# Patient Record
Sex: Female | Born: 1980 | Race: Black or African American | Hispanic: No | Marital: Single | Smoking: Never smoker
Health system: Southern US, Community
[De-identification: ages and names within clinical notes are randomized; demographics above are authoritative.]

## PROBLEM LIST (undated history)

## (undated) DIAGNOSIS — J45909 Unspecified asthma, uncomplicated: Secondary | ICD-10-CM

---

## 2017-08-21 ENCOUNTER — Emergency Department (HOSPITAL_BASED_OUTPATIENT_CLINIC_OR_DEPARTMENT_OTHER)
Admission: EM | Admit: 2017-08-21 | Discharge: 2017-08-21 | Disposition: A | Discharge status: Home / Self Care | Payer: Self-pay | Attending: Emergency Medicine | Admitting: Emergency Medicine

## 2017-08-21 ENCOUNTER — Other Ambulatory Visit: Payer: Self-pay

## 2017-08-21 ENCOUNTER — Encounter (HOSPITAL_BASED_OUTPATIENT_CLINIC_OR_DEPARTMENT_OTHER): Payer: Self-pay | Admitting: *Deleted

## 2017-08-21 DIAGNOSIS — J45909 Unspecified asthma, uncomplicated: Secondary | ICD-10-CM | POA: Insufficient documentation

## 2017-08-21 DIAGNOSIS — T7840XA Allergy, unspecified, initial encounter: Secondary | ICD-10-CM | POA: Insufficient documentation

## 2017-08-21 HISTORY — DX: Unspecified asthma, uncomplicated: J45.909

## 2017-08-21 MED ORDER — ALBUTEROL SULFATE HFA 108 (90 BASE) MCG/ACT IN AERS: 2.0000 | INHALATION_SPRAY | RESPIRATORY_TRACT | 3 refills | Status: AC | PRN

## 2017-08-21 NOTE — ED Notes (Signed)
RT assessed patient upon arrival. Stated she was at school and had an allergic reaction to grass. Given Albuterol MDI 3 puffs at school and took 2 benedryl. No distress noted at this time.

## 2017-08-21 NOTE — ED Notes (Signed)
ED Provider at bedside. 

## 2017-08-21 NOTE — ED Triage Notes (Signed)
Shortness of breathe started from grass.  They were cutting grass in school today.  Albuterol inhaler and 2 Benadryl  Were given at school PTA.

## 2017-08-21 NOTE — ED Provider Notes (Signed)
[facility] EMERGENCY DEPARTMENT Provider Note   CSN: [id number] Arrival date & time: 08/21/17  0865     History   Chief Complaint Chief Complaint  Patient presents with  . Shortness of Breath    HPI Dominique Franklin is a 37 y.o. female.  Patient c/o allergic reaction to grass. States hx same. Was at school and grass was being cut, states started to feel throat itching and swelling, and sob. Hx asthma. Was given 2 benadryl tablets and albuterol treatment and states feels improved. No preceding sore throat. No cough or uri symptoms. Recently asthma at baseline, uses Symbicort but no recent increased use of albuterol. Denies fever or chills. No chest pain or discomfort. No faintness or dizziness. No rash.   The history is provided by the patient.  Shortness of Breath  Pertinent negatives include no fever, no sore throat, no neck pain, no cough, no chest pain, no abdominal pain and no rash.    Past Medical History:  Diagnosis Date  . Asthma       OB History   None      Home Medications    Prior to Admission medications   Not on File    Family History No family history on file.  Social History Social History   Tobacco Use  . Smoking status: Not on file  Substance Use Topics  . Alcohol use: Not on file  . Drug use: Not on file     Allergies   Patient has no allergy information on record.   Review of Systems Review of Systems  Constitutional: Negative for fever.  HENT: Negative for sore throat.   Eyes: Negative for redness.  Respiratory: Positive for shortness of breath. Negative for cough.   Cardiovascular: Negative for chest pain.  Gastrointestinal: Negative for abdominal pain.  Genitourinary: Negative for flank pain.  Musculoskeletal: Negative for neck pain.  Skin: Negative for rash.  Neurological: Negative for syncope.  Hematological: Does not bruise/bleed easily.  Psychiatric/Behavioral: Negative for confusion.     Physical  Exam Updated Vital Signs BP (!) 141/83 (BP Location: Right Arm)   Pulse 73   Temp 97.8 F (36.6 C) (Oral)   Resp 18   Ht 1.702 m (5\' 7" )   Wt 101.6 kg (224 lb)   SpO2 100%   BMI 35.08 kg/m   Physical Exam  Constitutional: She appears well-developed and well-nourished.  HENT:  Head: Atraumatic.  Nose: Nose normal.  Mouth/Throat: Oropharynx is clear and moist.  Eyes: Conjunctivae are normal. No scleral icterus.  Neck: Neck supple. No tracheal deviation present.  Cardiovascular: Normal rate, regular rhythm, normal heart sounds and intact distal pulses.  Pulmonary/Chest: Effort normal and breath sounds normal. No stridor. No respiratory distress. She has no wheezes.  Abdominal: Normal appearance. She exhibits no distension. There is no tenderness.  Musculoskeletal: She exhibits no edema.  Neurological: She is alert.  Skin: Skin is warm and dry. No rash noted.  Psychiatric:  Mildly anxious appearing.   Nursing note and vitals reviewed.    ED Treatments / Results  Labs (all labs ordered are listed, but only abnormal results are displayed) Labs Reviewed - No data to display  EKG None  Radiology No results found.  Procedures Procedures (including critical care time)  Medications Ordered in ED Medications - No data to display   Initial Impression / Assessment and Plan / ED Course  I have reviewed the triage vital signs and the nursing notes.  Pertinent labs &  imaging results that were available during my care of the patient were reviewed by me and considered in my medical decision making (see chart for details).  Monitor, pulse ox.   Pulse ox 100%. No edema on exam. No wheezing.  Reviewed nursing notes and prior charts for additional history.   Recheck, no swelling, no sob, no itching, no wheezing.  Pt currently appears stable for d/c.      Final Clinical Impressions(s) / ED Diagnoses   Final diagnoses:  None    ED Discharge Orders    None        Cathren LaineSteinl, Danen Lapaglia, MD 08/21/17 1028

## 2017-08-21 NOTE — Discharge Instructions (Signed)
It was our pleasure to provide your ER care today - we hope that you feel better.  Take benadryl 25 mg every 6 hours for the next day, then as need.   Use albuterol inhaler as need if wheezing.  Follow up with primary care doctor in the coming week - discuss possible referral to allergist.   Return to ER if worse, new symptoms, difficulty breathing, throat swelling, other concern.

## 2018-06-25 ENCOUNTER — Other Ambulatory Visit: Payer: Self-pay | Admitting: Physician Assistant

## 2018-06-25 ENCOUNTER — Other Ambulatory Visit: Payer: Self-pay | Admitting: *Deleted

## 2018-06-25 DIAGNOSIS — M7989 Other specified soft tissue disorders: Secondary | ICD-10-CM

## 2018-07-19 ENCOUNTER — Ambulatory Visit
Admission: RE | Admit: 2018-07-19 | Discharge: 2018-07-19 | Disposition: A | Discharge status: Home / Self Care | Payer: No Typology Code available for payment source | Source: Ambulatory Visit | Attending: Physician Assistant | Admitting: Physician Assistant

## 2018-07-19 ENCOUNTER — Other Ambulatory Visit: Payer: Self-pay

## 2018-07-19 DIAGNOSIS — M7989 Other specified soft tissue disorders: Secondary | ICD-10-CM

## 2020-07-15 IMAGING — MG DIGITAL DIAGNOSTIC BILATERAL MAMMOGRAM WITH TOMO AND CAD
5 of 10 series · 5 of 30 positions shown · non-contrast
Comparison: None
COMPARISON: None

Addendum:
CLINICAL DATA: Patient presents complaining of left axillary
swelling some tenderness. Patient's referring clinician also reports
a small nodule along the inferior aspect the left axilla. Patient's
sister was diagnosed with breast carcinoma at age 41.

EXAM:
DIGITAL DIAGNOSTIC BILATERAL MAMMOGRAM WITH CAD AND TOMO
ULTRASOUND LEFT AXILLA

[L CC synth-2D]
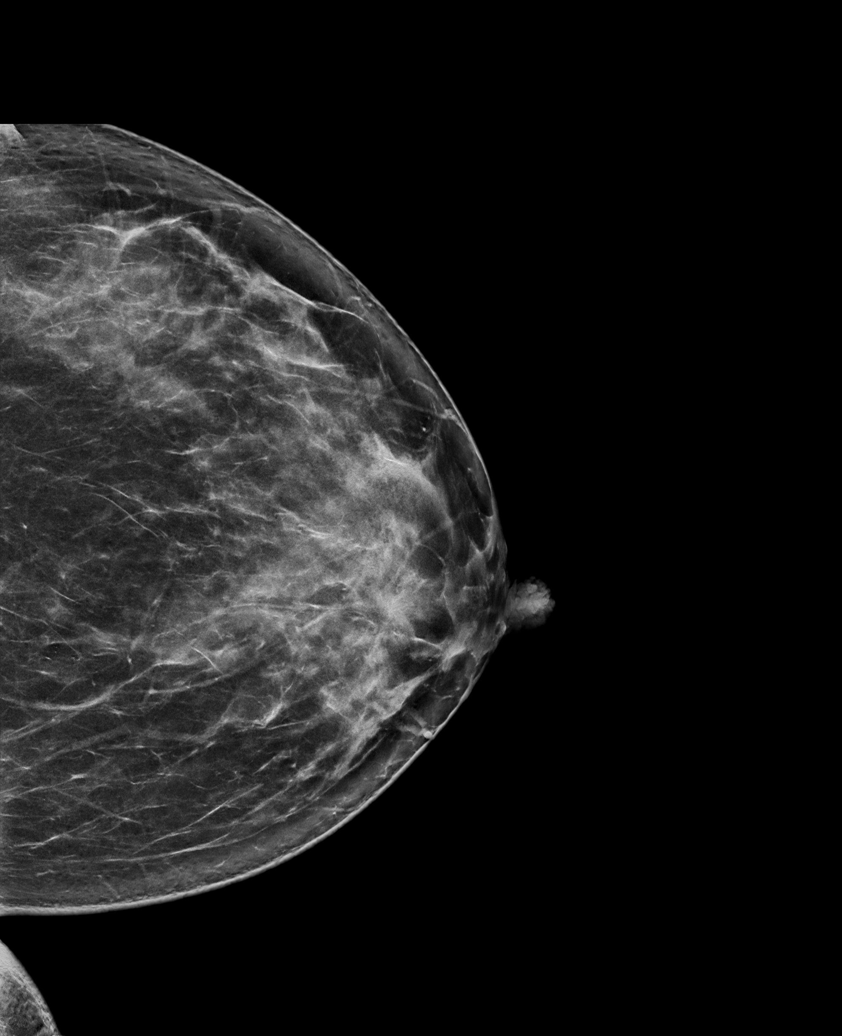

[L MLO synth-2D]
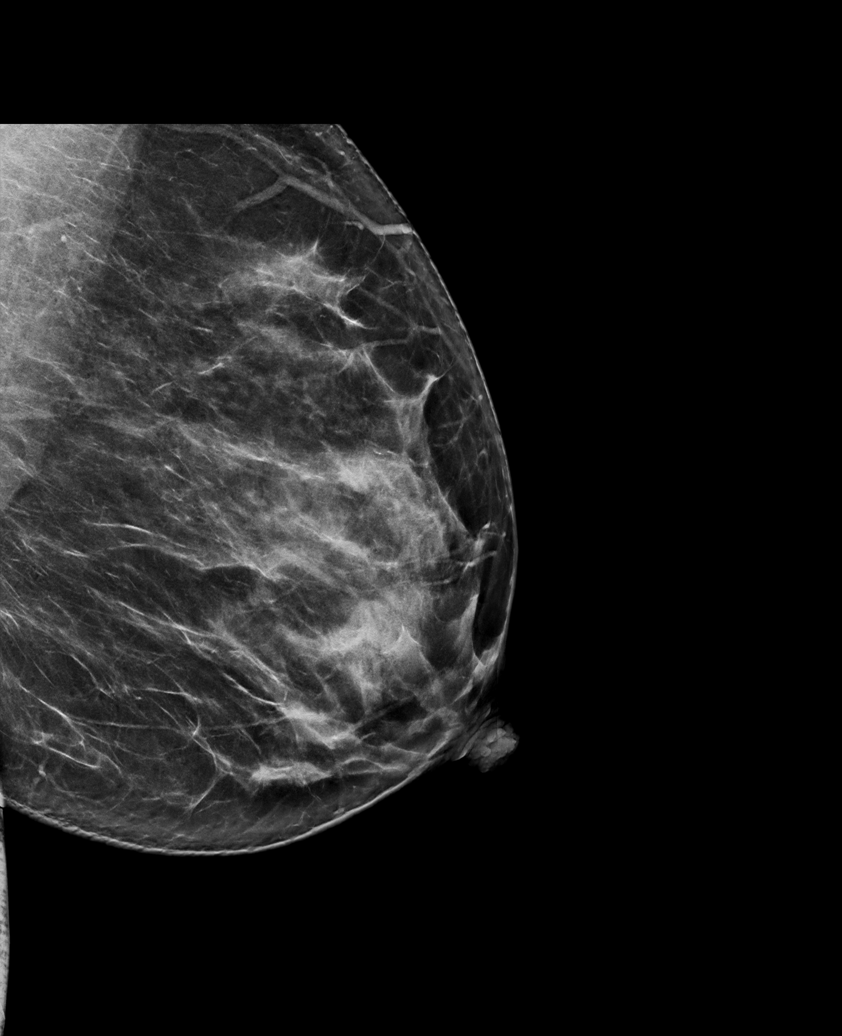

[L TAN synth-2D]
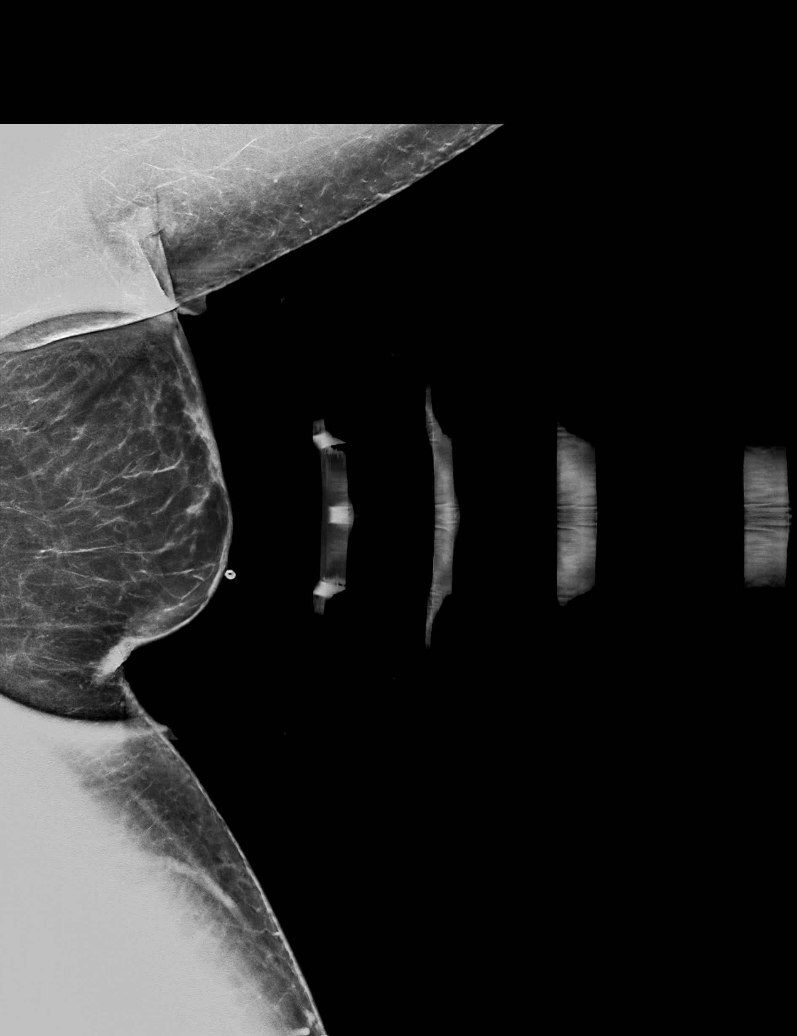

[R CC synth-2D]
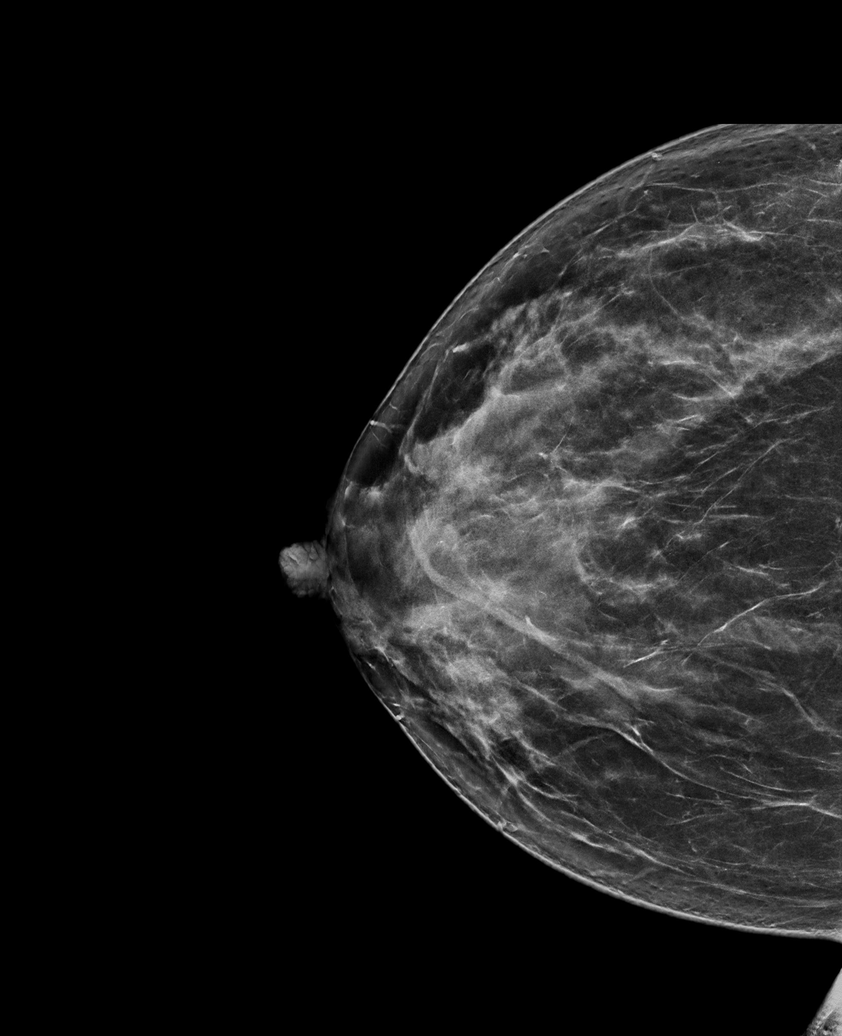

[R MLO synth-2D]
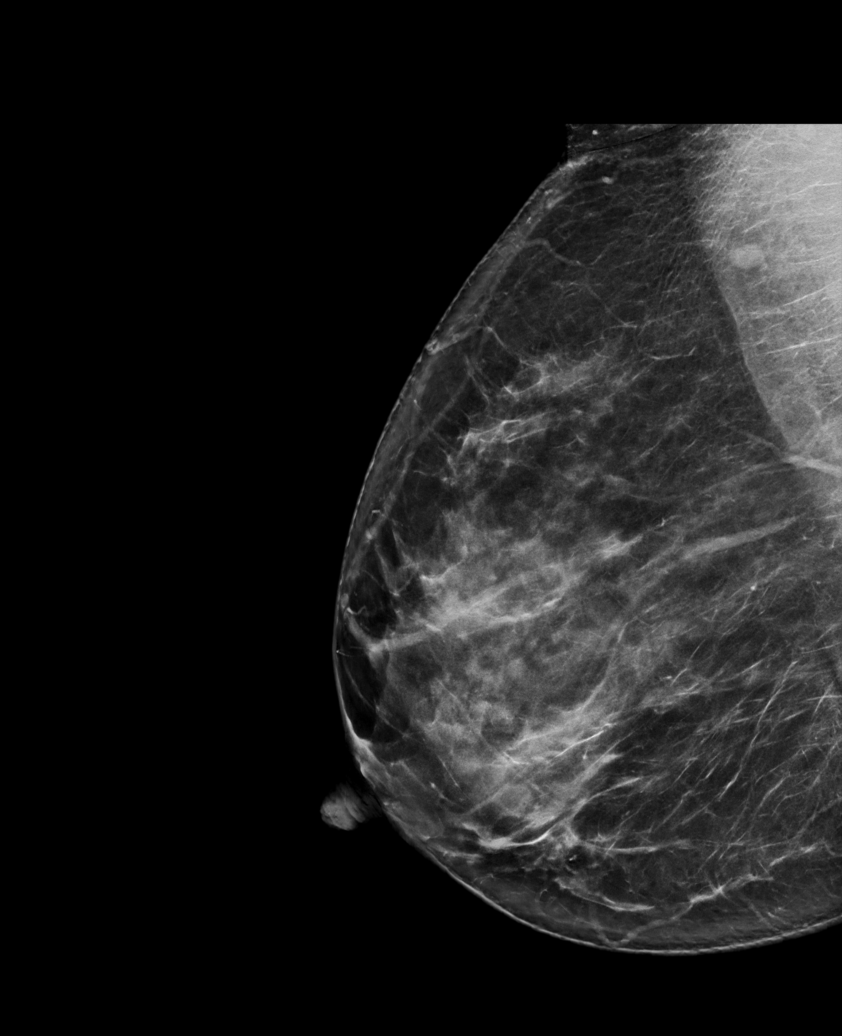

[5 of 30 positions shown; findings below may reference images not displayed]

ACR Breast Density Category c: The breast tissue is heterogeneously
dense, which may obscure small masses.
FINDINGS: There are no masses, areas of architectural distortion, areas of
significant asymmetry or suspicious calcifications. There is
asymmetric prominent subcutaneous fat in the axilla, greater on the
left.

Mammographic images were processed with CAD.

On physical exam, there is prominence of the left axillary
subcutaneous fat, soft to palpation, without a discrete palpable
mass.

Targeted ultrasound is performed, showing normal tissue throughout
the left axilla. No mass or enlarged or abnormal lymph node.
IMPRESSION: Negative exam.  No evidence of breast malignancy.

RECOMMENDATION:
Screening mammogram in one year.(Code:CA-Z-J4J)

I have discussed the findings and recommendations with the patient.
Results were also provided in writing at the conclusion of the
visit. If applicable, a reminder letter will be sent to the patient
regarding the next appointment.

BI-RADS CATEGORY  1: Negative.

ADDENDUM:
Patient's prior exams from April 25, 2014 are available for
comparison. There has been no change since the prior exam. Patient's
left axilla was evaluated at that time as well with a normal
ultrasound. There is no change to the current impression,
recommendation or BI-RADS category.

*** End of Addendum ***
ACR Breast Density Category c: The breast tissue is heterogeneously
dense, which may obscure small masses.
FINDINGS: There are no masses, areas of architectural distortion, areas of
significant asymmetry or suspicious calcifications. There is
asymmetric prominent subcutaneous fat in the axilla, greater on the
left.

Mammographic images were processed with CAD.

On physical exam, there is prominence of the left axillary
subcutaneous fat, soft to palpation, without a discrete palpable
mass.

Targeted ultrasound is performed, showing normal tissue throughout
the left axilla. No mass or enlarged or abnormal lymph node.
IMPRESSION: Negative exam.  No evidence of breast malignancy.

RECOMMENDATION:
Screening mammogram in one year.(Code:CA-Z-J4J)

I have discussed the findings and recommendations with the patient.
Results were also provided in writing at the conclusion of the
visit. If applicable, a reminder letter will be sent to the patient
regarding the next appointment.

BI-RADS CATEGORY  1: Negative.
